# Patient Record
Sex: Male | Born: 1990 | Race: White | Hispanic: No | Marital: Single | Smoking: Former smoker
Health system: Southern US, Community
[De-identification: ages and names within clinical notes are randomized; demographics above are authoritative.]

---

## 2008-08-12 ENCOUNTER — Emergency Department (HOSPITAL_COMMUNITY): Admission: EM | Admit: 2008-08-12 | Discharge: 2008-08-12 | Payer: Self-pay | Admitting: Family Medicine

## 2009-04-02 ENCOUNTER — Ambulatory Visit: Payer: Self-pay | Admitting: Internal Medicine

## 2009-04-02 DIAGNOSIS — K219 Gastro-esophageal reflux disease without esophagitis: Secondary | ICD-10-CM

## 2009-04-02 DIAGNOSIS — Z87442 Personal history of urinary calculi: Secondary | ICD-10-CM

## 2009-04-02 DIAGNOSIS — J019 Acute sinusitis, unspecified: Secondary | ICD-10-CM

## 2009-04-04 ENCOUNTER — Telehealth: Payer: Self-pay | Admitting: Internal Medicine

## 2010-08-12 LAB — POCT I-STAT, CHEM 8
HCT: 47 % (ref 39.0–52.0)
Hemoglobin: 16 g/dL (ref 13.0–17.0)
Potassium: 4.4 mEq/L (ref 3.5–5.1)
Sodium: 138 mEq/L (ref 135–145)

## 2010-08-12 LAB — POCT URINALYSIS DIP (DEVICE)
Glucose, UA: NEGATIVE mg/dL
Nitrite: NEGATIVE
Specific Gravity, Urine: 1.01 (ref 1.005–1.030)
Urobilinogen, UA: 0.2 mg/dL (ref 0.0–1.0)

## 2011-03-01 ENCOUNTER — Other Ambulatory Visit: Payer: Self-pay | Admitting: Gastroenterology

## 2011-03-01 DIAGNOSIS — K219 Gastro-esophageal reflux disease without esophagitis: Secondary | ICD-10-CM

## 2011-03-05 ENCOUNTER — Ambulatory Visit
Admission: RE | Admit: 2011-03-05 | Discharge: 2011-03-05 | Disposition: A | Discharge status: Home / Self Care | Payer: BC Managed Care – PPO | Source: Ambulatory Visit | Attending: Gastroenterology | Admitting: Gastroenterology

## 2011-03-05 DIAGNOSIS — K219 Gastro-esophageal reflux disease without esophagitis: Secondary | ICD-10-CM

## 2011-03-05 IMAGING — US US ABDOMEN COMPLETE
1 series · 14 of 25 positions shown · non-contrast
Comparison: None.

CLINICAL DATA: Abdominal discomfort for 8 months

COMPLETE ABDOMINAL ULTRASOUND

[Series 1: us abdomen complete · 0.26mm/px · 14 of 62 slices shown]
[im 1/62]
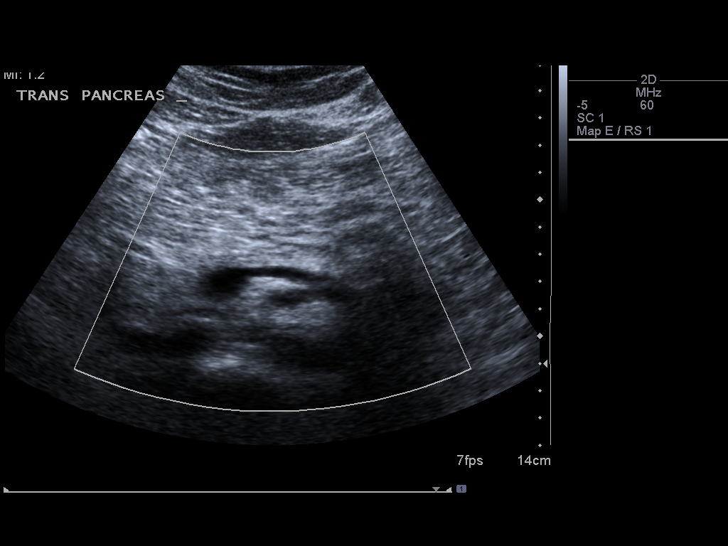
[im 6/62]
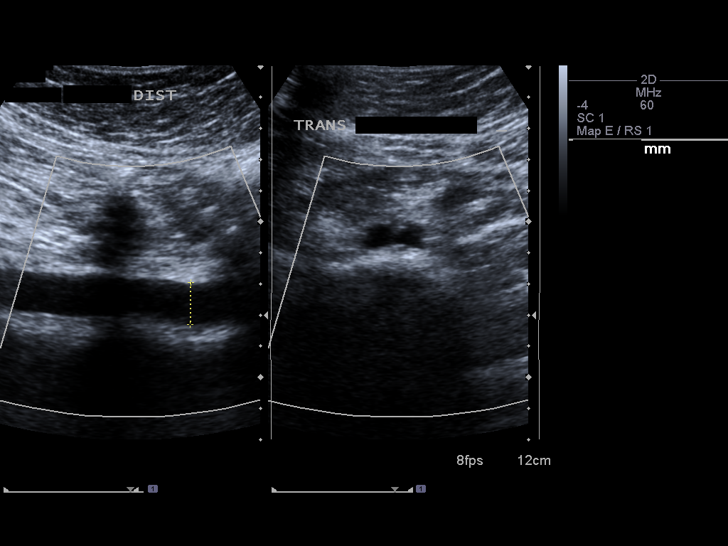
[im 11/62]
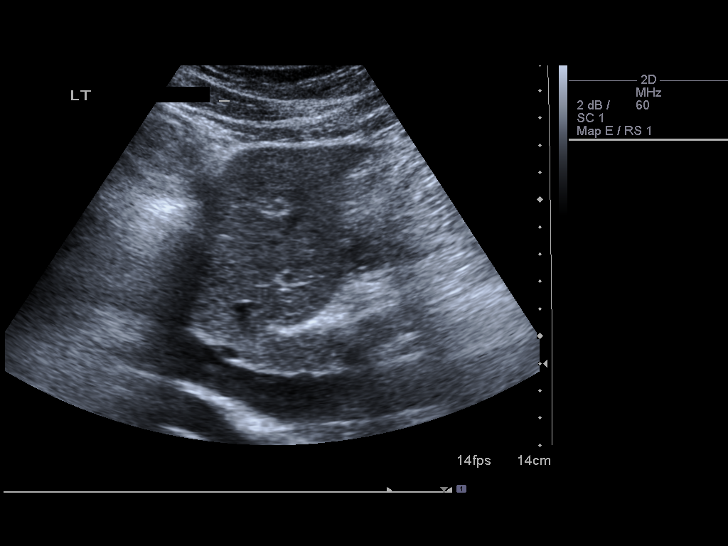
[im 16/62]
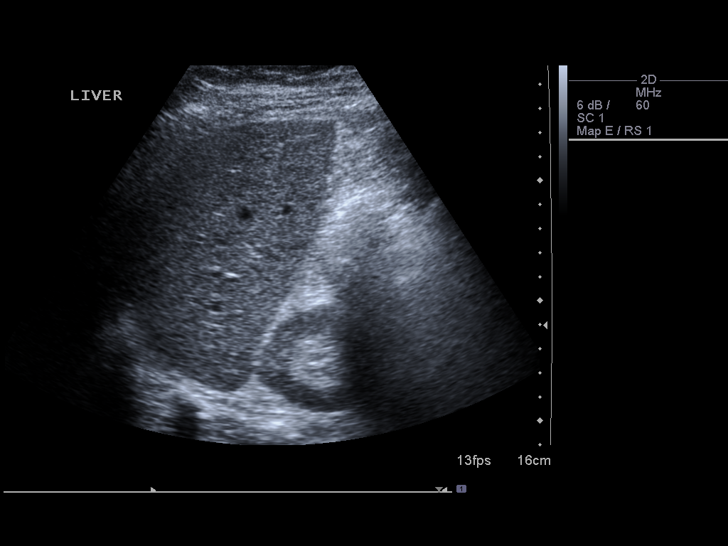
[im 21/62]
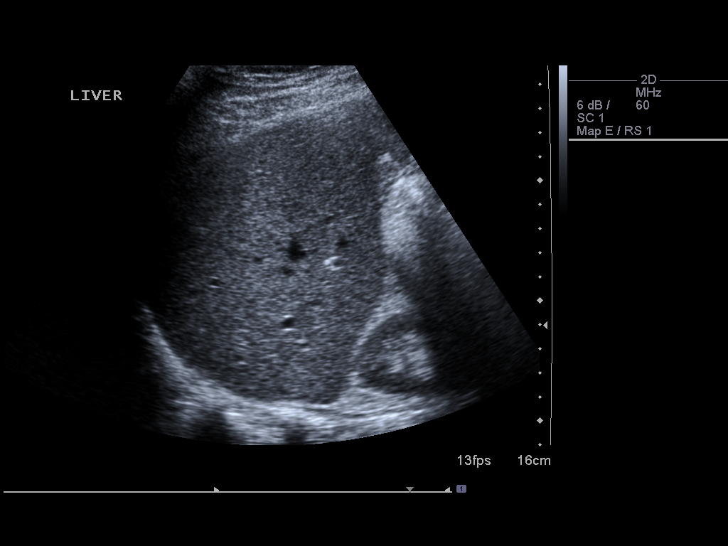
[im 23/62]
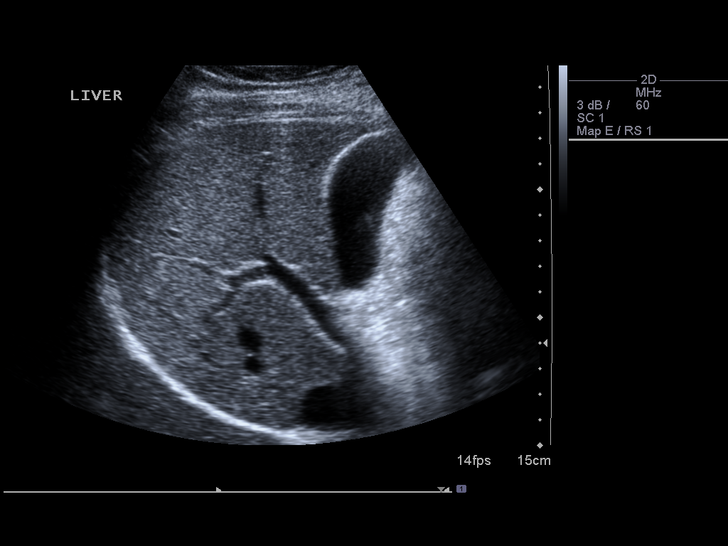
[im 28/62]
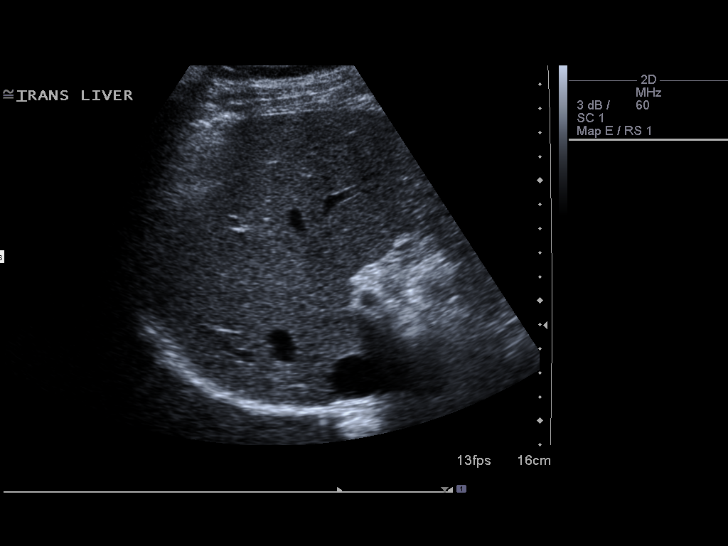
[im 34/62]
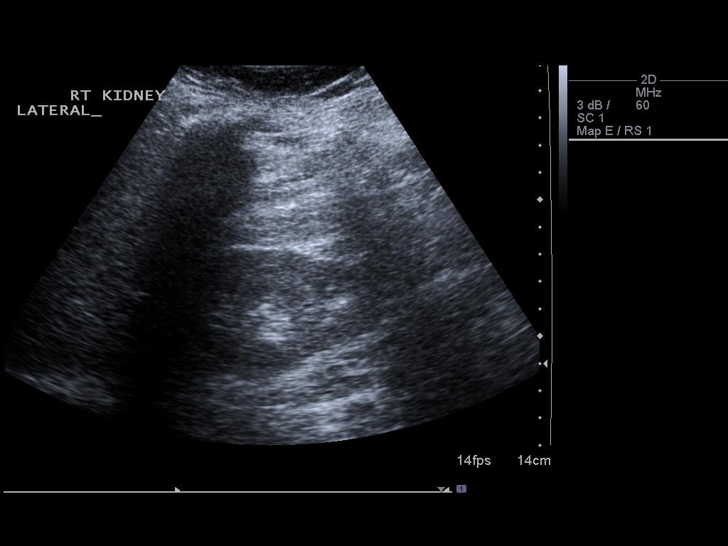
[im 39/62]
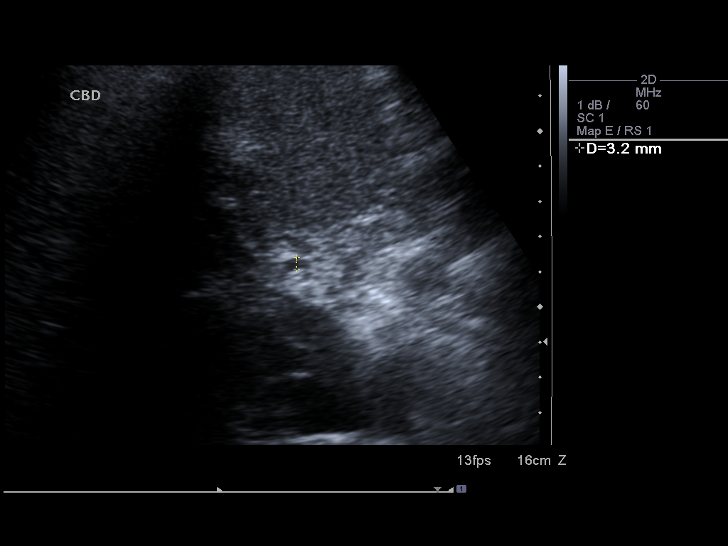
[im 41/62]
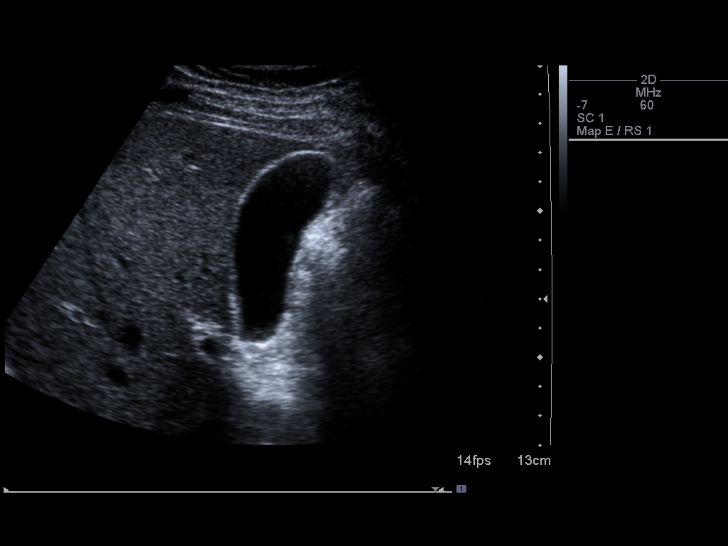
[im 46/62]
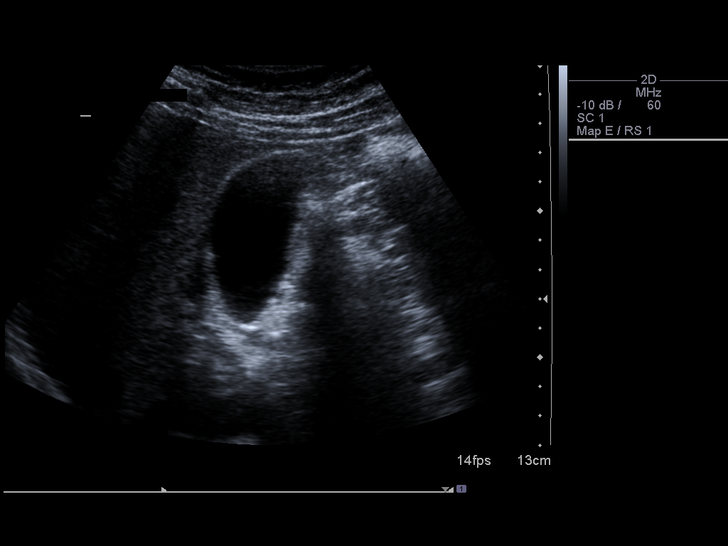
[im 51/62]
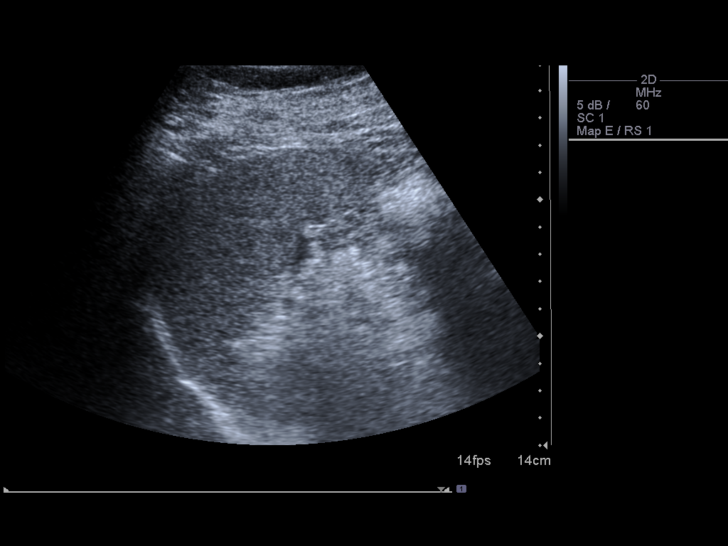
[im 56/62]
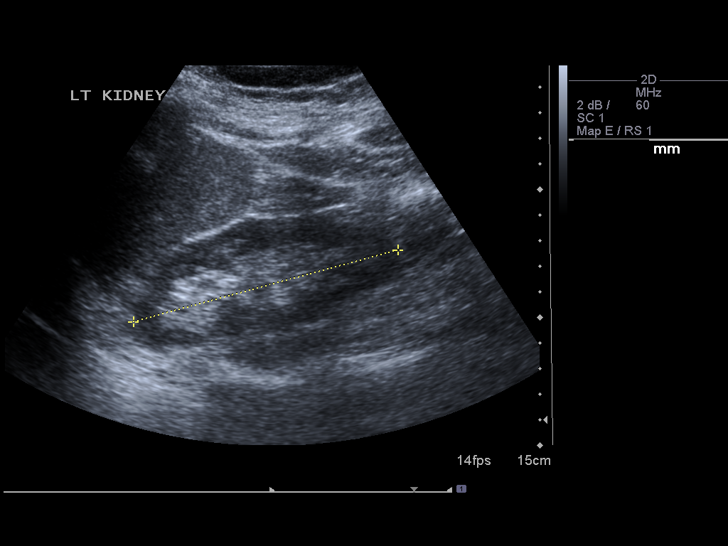
[im 62/62]
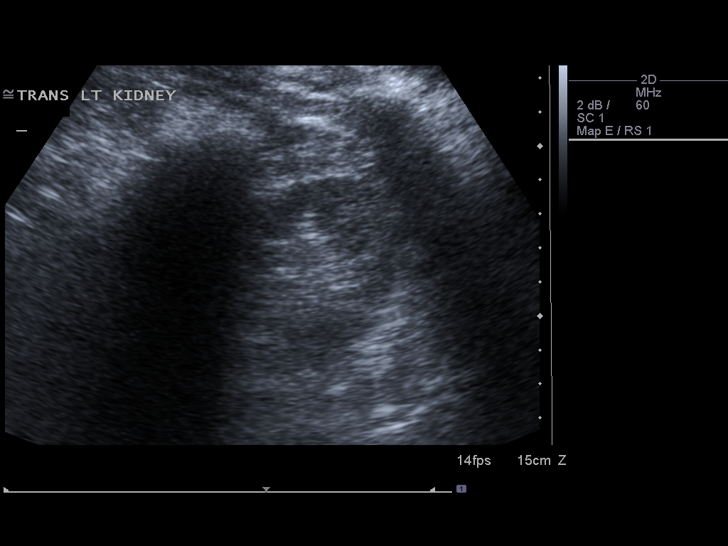

[14 of 25 positions shown; findings below may reference images not displayed]

FINDINGS: Gallbladder:  The gallbladder is visualized and no gallstones are
noted.  There is no pain over the gallbladder with compression.

Common bile duct:  The common bile duct is normal measuring 3.2 mm
in diameter.

Liver:  The liver has a normal echogenic pattern.  No ductal
dilatation is seen.

IVC:  Appears normal.

Pancreas:  No focal abnormality seen.

Spleen:  The spleen is normal measuring 7.2 cm sagittally.

Right Kidney:  No hydronephrosis is seen.  The right kidney
measures 10.3 cm sagittally.

Left Kidney:  No hydronephrosis.  The left kidney measures 10.7 cm.

Abdominal aorta:  The abdominal aorta is normal in caliber.
IMPRESSION: Negative abdominal ultrasound.

## 2012-02-14 ENCOUNTER — Ambulatory Visit (HOSPITAL_COMMUNITY)
Admission: RE | Admit: 2012-02-14 | Discharge: 2012-02-14 | Disposition: A | Discharge status: Home / Self Care | Payer: BC Managed Care – PPO | Source: Ambulatory Visit | Attending: Family Medicine | Admitting: Family Medicine

## 2012-02-14 DIAGNOSIS — M7989 Other specified soft tissue disorders: Secondary | ICD-10-CM | POA: Insufficient documentation

## 2012-02-14 DIAGNOSIS — M79606 Pain in leg, unspecified: Secondary | ICD-10-CM

## 2013-08-28 ENCOUNTER — Other Ambulatory Visit: Payer: Self-pay | Admitting: Physical Medicine and Rehabilitation

## 2013-08-28 DIAGNOSIS — M545 Low back pain, unspecified: Secondary | ICD-10-CM

## 2013-09-06 ENCOUNTER — Ambulatory Visit
Admission: RE | Admit: 2013-09-06 | Discharge: 2013-09-06 | Disposition: A | Discharge status: Home / Self Care | Payer: BC Managed Care – PPO | Source: Ambulatory Visit | Attending: Physical Medicine and Rehabilitation | Admitting: Physical Medicine and Rehabilitation

## 2013-09-06 VITALS — BP 118/78 | HR 82

## 2013-09-06 DIAGNOSIS — M545 Low back pain, unspecified: Secondary | ICD-10-CM

## 2013-09-06 MED ORDER — METHYLPREDNISOLONE ACETATE 40 MG/ML INJ SUSP (RADIOLOG
120.0000 mg | Freq: Once | INTRAMUSCULAR | Status: AC
Start: 2013-09-06 — End: 2013-09-06
  Administered 2013-09-06: 120 mg via EPIDURAL

## 2013-09-06 MED ORDER — IOHEXOL 180 MG/ML  SOLN
1.0000 mL | Freq: Once | INTRAMUSCULAR | Status: AC | PRN
  Administered 2013-09-06: 1 mL via EPIDURAL

## 2013-09-06 NOTE — Discharge Instructions (Signed)

## 2013-09-07 ENCOUNTER — Other Ambulatory Visit: Payer: BC Managed Care – PPO
